# Patient Record
Sex: Male | Born: 1960 | Race: White | Hispanic: No | Marital: Married | Smoking: Never smoker
Health system: Southern US, Community
[De-identification: ages and names within clinical notes are randomized; demographics above are authoritative.]

## PROBLEM LIST (undated history)

## (undated) DIAGNOSIS — I5189 Other ill-defined heart diseases: Secondary | ICD-10-CM

## (undated) DIAGNOSIS — G4733 Obstructive sleep apnea (adult) (pediatric): Secondary | ICD-10-CM

## (undated) DIAGNOSIS — I071 Rheumatic tricuspid insufficiency: Secondary | ICD-10-CM

## (undated) DIAGNOSIS — G4730 Sleep apnea, unspecified: Secondary | ICD-10-CM

## (undated) DIAGNOSIS — J309 Allergic rhinitis, unspecified: Secondary | ICD-10-CM

## (undated) DIAGNOSIS — I340 Nonrheumatic mitral (valve) insufficiency: Secondary | ICD-10-CM

## (undated) DIAGNOSIS — I2089 Other forms of angina pectoris: Secondary | ICD-10-CM

## (undated) DIAGNOSIS — J45909 Unspecified asthma, uncomplicated: Secondary | ICD-10-CM

## (undated) DIAGNOSIS — E66811 Obesity, class 1: Secondary | ICD-10-CM

## (undated) HISTORY — PX: FRACTURE SURGERY: SHX138

## (undated) HISTORY — PX: COLONOSCOPY: SHX174

---

## 2014-03-12 ENCOUNTER — Ambulatory Visit: Payer: Self-pay | Admitting: Gastroenterology

## 2014-03-14 LAB — PATHOLOGY REPORT

## 2017-07-14 ENCOUNTER — Encounter
Admission: RE | Admit: 2017-07-14 | Discharge: 2017-07-14 | Disposition: A | Discharge status: Home / Self Care | Payer: BLUE CROSS/BLUE SHIELD | Source: Ambulatory Visit | Attending: Surgery | Admitting: Surgery

## 2017-07-14 HISTORY — DX: Unspecified asthma, uncomplicated: J45.909

## 2017-07-14 NOTE — Patient Instructions (Signed)
  Your procedure is scheduled on: 07-22-17  Report to Same Day Surgery 2nd floor medical mall [facility](Medical Mall Entrance-take elevator on left to 2nd floor.  Check in with surgery information desk.) To find out your arrival time please call [phone] between 1PM - 3PM on 07-21-17  Remember: Instructions that are not followed completely may result in serious medical risk, up to and including death, or upon the discretion of your surgeon and anesthesiologist your surgery may need to be rescheduled.    _x___ 1. Do not eat food or drink liquids after midnight. No gum chewing or hard candies.     __x__ 2. No Alcohol for 24 hours before or after surgery.   __x__3. No Smoking for 24 prior to surgery.   ____  4. Bring all medications with you on the day of surgery if instructed.    __x__ 5. Notify your doctor if there is any change in your medical condition     (cold, fever, infections).     Do not wear jewelry, make-up, hairpins, clips or nail polish.  Do not wear lotions, powders, or perfumes. You may wear deodorant.  Do not shave 48 hours prior to surgery. Men may shave face and neck.  Do not bring valuables to the hospital.    [facility] is not responsible for any belongings or valuables.               Contacts, dentures or bridgework may not be worn into surgery.  Leave your suitcase in the car. After surgery it may be brought to your room.  For patients admitted to the hospital, discharge time is determined by your treatment team.   Patients discharged the day of surgery will not be allowed to drive home.  You will need someone to drive you home and stay with you the night of your procedure.    Please read over the following fact sheets that you were given:    ____ Take anti-hypertensive (unless it includes a diuretic), cardiac, seizure, asthma,     anti-reflux and psychiatric medicines. These include:  1. NONE  2.  3.  4.  5.  6.  ____Fleets enema or Magnesium Citrate as  directed.   ____ Use CHG Soap or sage wipes as directed on instruction sheet   _X___ Use inhalers on the day of surgery and bring to hospital day of surgery-USE ALBUTEROL INHALER AT HOME AND BRING TO HOSPITAL  ____ Stop Metformin and Janumet 2 days prior to surgery.    ____ Take 1/2 of usual insulin dose the night before surgery and none on the morning surgery.   ____ Follow recommendations from Cardiologist, Pulmonologist or PCP regarding stopping Aspirin, Coumadin, Pllavix ,Eliquis, Effient, or Pradaxa, and Pletal.  X____Stop Anti-inflammatories such as Advil, Aleve, Ibuprofen, Motrin, Naproxen, Naprosyn, Goodies powders or aspirin products NOW-OK to take Tylenol    _x___ Stop supplements until after surgery-STOP FISH OIL NOW-MAY RESUME AFTER SURGERY  ____ Bring C-Pap to the hospital.

## 2017-07-21 MED ORDER — CEFAZOLIN SODIUM-DEXTROSE 2-4 GM/100ML-% IV SOLN
2.0000 g | Freq: Once | INTRAVENOUS | Status: AC
Start: 2017-07-21 — End: 2017-07-22
  Administered 2017-07-22: 2 g via INTRAVENOUS

## 2017-07-22 ENCOUNTER — Encounter
Admission: RE | Disposition: A | Discharge status: Home / Self Care | Payer: Self-pay | Source: Ambulatory Visit | Attending: Surgery

## 2017-07-22 ENCOUNTER — Ambulatory Visit
Admission: RE | Admit: 2017-07-22 | Discharge: 2017-07-22 | Disposition: A | Discharge status: Home / Self Care | Payer: BLUE CROSS/BLUE SHIELD | Source: Ambulatory Visit | Attending: Surgery | Admitting: Surgery

## 2017-07-22 ENCOUNTER — Ambulatory Visit: Payer: BLUE CROSS/BLUE SHIELD | Admitting: Anesthesiology

## 2017-07-22 ENCOUNTER — Encounter: Payer: Self-pay | Admitting: *Deleted

## 2017-07-22 DIAGNOSIS — Z7951 Long term (current) use of inhaled steroids: Secondary | ICD-10-CM | POA: Insufficient documentation

## 2017-07-22 DIAGNOSIS — J45909 Unspecified asthma, uncomplicated: Secondary | ICD-10-CM | POA: Diagnosis not present

## 2017-07-22 DIAGNOSIS — K429 Umbilical hernia without obstruction or gangrene: Secondary | ICD-10-CM | POA: Diagnosis present

## 2017-07-22 HISTORY — PX: UMBILICAL HERNIA REPAIR: SHX196

## 2017-07-22 SURGERY — REPAIR, HERNIA, UMBILICAL, ADULT
Anesthesia: General

## 2017-07-22 MED ORDER — BUPIVACAINE-EPINEPHRINE (PF) 0.5% -1:200000 IJ SOLN
INTRAMUSCULAR | Status: DC | PRN
  Administered 2017-07-22: 6 mL via PERINEURAL

## 2017-07-22 MED ORDER — LACTATED RINGERS IV SOLN
INTRAVENOUS | Status: DC
  Administered 2017-07-22: 06:00:00 via INTRAVENOUS

## 2017-07-22 MED ORDER — FENTANYL CITRATE (PF) 100 MCG/2ML IJ SOLN
25.0000 ug | INTRAMUSCULAR | Status: DC | PRN
  Administered 2017-07-22 (×4): 25 ug via INTRAVENOUS

## 2017-07-22 MED ORDER — PHENYLEPHRINE HCL 10 MG/ML IJ SOLN
INTRAMUSCULAR | Status: AC
  Filled 2017-07-22: qty 1

## 2017-07-22 MED ORDER — HYDROCODONE-ACETAMINOPHEN 5-325 MG PO TABS
1.0000 | ORAL_TABLET | ORAL | Status: DC | PRN
  Administered 2017-07-22: 1 via ORAL

## 2017-07-22 MED ORDER — FENTANYL CITRATE (PF) 100 MCG/2ML IJ SOLN
INTRAMUSCULAR | Status: DC | PRN
  Administered 2017-07-22 (×2): 50 ug via INTRAVENOUS

## 2017-07-22 MED ORDER — HYDROCODONE-ACETAMINOPHEN 5-325 MG PO TABS
ORAL_TABLET | ORAL | Status: AC
  Filled 2017-07-22: qty 1

## 2017-07-22 MED ORDER — BUPIVACAINE-EPINEPHRINE (PF) 0.5% -1:200000 IJ SOLN
INTRAMUSCULAR | Status: AC
  Filled 2017-07-22: qty 30

## 2017-07-22 MED ORDER — GLYCOPYRROLATE 0.2 MG/ML IJ SOLN
INTRAMUSCULAR | Status: DC | PRN
  Administered 2017-07-22: 0.2 mg via INTRAVENOUS

## 2017-07-22 MED ORDER — MIDAZOLAM HCL 2 MG/2ML IJ SOLN
INTRAMUSCULAR | Status: AC
  Filled 2017-07-22: qty 2

## 2017-07-22 MED ORDER — ONDANSETRON HCL 4 MG/2ML IJ SOLN
INTRAMUSCULAR | Status: AC
  Filled 2017-07-22: qty 2

## 2017-07-22 MED ORDER — FENTANYL CITRATE (PF) 100 MCG/2ML IJ SOLN
INTRAMUSCULAR | Status: AC
  Filled 2017-07-22: qty 2

## 2017-07-22 MED ORDER — MIDAZOLAM HCL 2 MG/2ML IJ SOLN
INTRAMUSCULAR | Status: DC | PRN
  Administered 2017-07-22: 2 mg via INTRAVENOUS

## 2017-07-22 MED ORDER — ONDANSETRON HCL 4 MG/2ML IJ SOLN: 4.0000 mg | Freq: Once | INTRAMUSCULAR | Status: DC | PRN

## 2017-07-22 MED ORDER — LIDOCAINE HCL (CARDIAC) 20 MG/ML IV SOLN
INTRAVENOUS | Status: DC | PRN
  Administered 2017-07-22: 100 mg via INTRAVENOUS

## 2017-07-22 MED ORDER — FENTANYL CITRATE (PF) 100 MCG/2ML IJ SOLN
INTRAMUSCULAR | Status: AC
  Administered 2017-07-22: 25 ug via INTRAVENOUS
  Filled 2017-07-22: qty 2

## 2017-07-22 MED ORDER — PHENYLEPHRINE HCL 10 MG/ML IJ SOLN
INTRAMUSCULAR | Status: DC | PRN
  Administered 2017-07-22 (×5): 100 ug via INTRAVENOUS

## 2017-07-22 MED ORDER — FAMOTIDINE 20 MG PO TABS
ORAL_TABLET | ORAL | Status: AC
  Administered 2017-07-22: 20 mg via ORAL
  Filled 2017-07-22: qty 1

## 2017-07-22 MED ORDER — PROPOFOL 500 MG/50ML IV EMUL
INTRAVENOUS | Status: AC
  Filled 2017-07-22: qty 50

## 2017-07-22 MED ORDER — FAMOTIDINE 20 MG PO TABS
20.0000 mg | ORAL_TABLET | Freq: Once | ORAL | Status: AC
  Administered 2017-07-22: 20 mg via ORAL

## 2017-07-22 MED ORDER — HYDROCODONE-ACETAMINOPHEN 5-325 MG PO TABS: 1.0000 | ORAL_TABLET | ORAL | 0 refills | Status: DC | PRN

## 2017-07-22 MED ORDER — PROPOFOL 10 MG/ML IV BOLUS
INTRAVENOUS | Status: DC | PRN
  Administered 2017-07-22: 180 mg via INTRAVENOUS

## 2017-07-22 MED ORDER — ONDANSETRON HCL 4 MG/2ML IJ SOLN
INTRAMUSCULAR | Status: DC | PRN
  Administered 2017-07-22: 4 mg via INTRAVENOUS

## 2017-07-22 MED ORDER — DEXAMETHASONE SODIUM PHOSPHATE 10 MG/ML IJ SOLN
INTRAMUSCULAR | Status: DC | PRN
  Administered 2017-07-22: 10 mg via INTRAVENOUS

## 2017-07-22 MED ORDER — DEXAMETHASONE SODIUM PHOSPHATE 10 MG/ML IJ SOLN
INTRAMUSCULAR | Status: AC
  Filled 2017-07-22: qty 1

## 2017-07-22 MED ORDER — LIDOCAINE HCL (PF) 2 % IJ SOLN
INTRAMUSCULAR | Status: AC
Start: 2017-07-22 — End: 2017-07-22
  Filled 2017-07-22: qty 2

## 2017-07-22 MED ORDER — CEFAZOLIN SODIUM-DEXTROSE 2-4 GM/100ML-% IV SOLN
INTRAVENOUS | Status: AC
  Filled 2017-07-22: qty 100

## 2017-07-22 SURGICAL SUPPLY — 28 items
BLADE CLIPPER SURG (BLADE) ×2 IMPLANT
BLADE SURG 15 STRL LF DISP TIS (BLADE) ×1 IMPLANT
BLADE SURG 15 STRL SS (BLADE) ×1
CANISTER SUCT 1200ML W/VALVE (MISCELLANEOUS) ×2 IMPLANT
CHLORAPREP W/TINT 26ML (MISCELLANEOUS) ×2 IMPLANT
DECANTER SPIKE VIAL GLASS SM (MISCELLANEOUS) ×2 IMPLANT
DERMABOND ADVANCED (GAUZE/BANDAGES/DRESSINGS) ×1
DERMABOND ADVANCED .7 DNX12 (GAUZE/BANDAGES/DRESSINGS) ×1 IMPLANT
DRAPE LAPAROTOMY 77X122 PED (DRAPES) ×2 IMPLANT
ELECT REM PT RETURN 9FT ADLT (ELECTROSURGICAL) ×2
ELECTRODE REM PT RTRN 9FT ADLT (ELECTROSURGICAL) ×1 IMPLANT
GLOVE BIO SURGEON STRL SZ7 (GLOVE) ×4 IMPLANT
GLOVE BIO SURGEON STRL SZ7.5 (GLOVE) ×6 IMPLANT
GOWN STRL REUS W/ TWL LRG LVL3 (GOWN DISPOSABLE) ×3 IMPLANT
GOWN STRL REUS W/TWL LRG LVL3 (GOWN DISPOSABLE) ×3
KIT RM TURNOVER STRD PROC AR (KITS) ×2 IMPLANT
LABEL OR SOLS (LABEL) ×2 IMPLANT
MESH SYNTHETIC 4X6 SOFT BARD (Mesh General) ×1 IMPLANT
MESH SYNTHETIC SOFT BARD 4X6 (Mesh General) ×1 IMPLANT
NEEDLE HYPO 25X1 1.5 SAFETY (NEEDLE) ×2 IMPLANT
NS IRRIG 500ML POUR BTL (IV SOLUTION) ×2 IMPLANT
PACK BASIN MINOR ARMC (MISCELLANEOUS) ×2 IMPLANT
SUT CHROMIC 3 0 SH 27 (SUTURE) ×2 IMPLANT
SUT CHROMIC 4 0 RB 1X27 (SUTURE) IMPLANT
SUT MNCRL+ 5-0 UNDYED PC-3 (SUTURE) ×1 IMPLANT
SUT MONOCRYL 5-0 (SUTURE) ×1
SUT SURGILON 0 30 BLK (SUTURE) ×2 IMPLANT
SYRINGE 10CC LL (SYRINGE) ×2 IMPLANT

## 2017-07-22 NOTE — Anesthesia Postprocedure Evaluation (Signed)
Anesthesia Post Note  Patient: Kenneth PatellaBilly F Partin Jr.  Procedure(s) Performed: Procedure(s) (LRB): HERNIA REPAIR UMBILICAL ADULT (N/A)  Patient location during evaluation: PACU Anesthesia Type: General Level of consciousness: awake and alert Pain management: pain level controlled Vital Signs Assessment: post-procedure vital signs reviewed and stable Respiratory status: spontaneous breathing, nonlabored ventilation, respiratory function stable and patient connected to nasal cannula oxygen Cardiovascular status: blood pressure returned to baseline and stable Postop Assessment: no signs of nausea or vomiting Anesthetic complications: no     Last Vitals:  Vitals:   07/22/17 1001 07/22/17 1016  BP: 120/82 117/81  Pulse: 60 65  Resp: 14 12  Temp:      Last Pain:  Vitals:   07/22/17 1001  TempSrc:   PainSc: 2                  Wray Goehring S

## 2017-07-22 NOTE — Anesthesia Procedure Notes (Signed)
Procedure Name: LMA Insertion Date/Time: 07/22/2017 7:43 AM Performed by: Irving BurtonBACHICH, Pippa Hanif Pre-anesthesia Checklist: Patient identified, Emergency Drugs available, Suction available and Patient being monitored Patient Re-evaluated:Patient Re-evaluated prior to induction Oxygen Delivery Method: Circle system utilized Preoxygenation: Pre-oxygenation with 100% oxygen Induction Type: IV induction Ventilation: Mask ventilation without difficulty LMA: LMA inserted LMA Size: 4.5 Number of attempts: 1 Placement Confirmation: positive ETCO2 and breath sounds checked- equal and bilateral Tube secured with: Tape Dental Injury: Teeth and Oropharynx as per pre-operative assessment

## 2017-07-22 NOTE — Anesthesia Post-op Follow-up Note (Cosign Needed)
Anesthesia QCDR form completed.        

## 2017-07-22 NOTE — Transfer of Care (Signed)
Immediate Anesthesia Transfer of Care Note  Patient: Kenneth PatellaBilly F Yusupov Jr.  Procedure(s) Performed: Procedure(s) with comments: HERNIA REPAIR UMBILICAL ADULT (N/A) - with mesh  Patient Location: PACU  Anesthesia Type:General  Level of Consciousness: sedated  Airway & Oxygen Therapy: Patient connected to face mask oxygen  Post-op Assessment: Post -op Vital signs reviewed and stable  Post vital signs: stable  Last Vitals:  Vitals:   07/22/17 0839 07/22/17 0840  BP: 115/79 115/79  Pulse:  73  Resp: 14 14  Temp: 36.7 C     Last Pain:  Vitals:   07/22/17 0603  TempSrc: Tympanic         Complications: No apparent anesthesia complications

## 2017-07-22 NOTE — Discharge Instructions (Signed)
AMBULATORY SURGERY  DISCHARGE INSTRUCTIONS   1) The drugs that you were given will stay in your system until tomorrow so for the next 24 hours you should not:  A) Drive an automobile B) Make any legal decisions C) Drink any alcoholic beverage   2) You may resume regular meals tomorrow.  Today it is better to start with liquids and gradually work up to solid foods.  You may eat anything you prefer, but it is better to start with liquids, then soup and crackers, and gradually work up to solid foods.   3) Please notify your doctor immediately if you have any unusual bleeding, trouble breathing, redness and pain at the surgery site, drainage, fever, or pain not relieved by medication. 4)   5) Your post-operative visit with Dr.                                     is: Date:                        Time:    Please call to schedule your post-operative visit.  6) Additional Instructions: Take Tylenol or Norco if needed for pain.  7) Should not drive or do anything dangerous when taking Norco.  8) May shower and blot dry.  9) Avoid straining and heavy lifting.
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
## 2017-07-22 NOTE — Op Note (Signed)
OPERATIVE REPORT  PREOPERATIVE  DIAGNOSIS: . Umbilical hernia  POSTOPERATIVE DIAGNOSIS: . Umbilical hernia  PROCEDURE: . Umbilical hernia repair  ANESTHESIA:  General  SURGEON: Renda RollsWilton Antonette Hendricks  MD   INDICATIONS: . He reports recent bulging at the umbilicus. He had physical findings of an umbilical hernia with fascial ring defect. Repair was recommended for definitive treatment.  With the patient on the operating table in the supine position he was placed under general anesthesia. The abdomen was prepared with clippers and with ChloraPrep and draped in a sterile manner. A supraumbilical transversely oriented curvilinear incision was made and carried down through subcutaneous tissues. Several small bleeding points were cauterized. The umbilical hernia sac was dissected circumferentially and separated from the skin of the umbilicus. Its continuity with the peritoneal cavity was demonstrated. The sac was dissected free from the fascial ring defect and inverted back into the peritoneal cavity. The properitoneal fat was separated from the fascial ring defect. Bard soft mesh was cut to create a circular shape of 1.8 cm in diameter. This was placed into the properitoneal plane and sutured to the overlying fascia with through and through 0 Surgilon. Next the repair was carried out with a transversely oriented suture line of interrupted 0 Surgilon figure-of-eight sutures incorporating each suture into the mesh. The subcutaneous tissues were infiltrated with half percent Sensorcaine with epinephrine. The skin of the umbilicus was sutured to the deep fascia with 3-0 chromic. The skin was closed with running 5-0 Monocryl subcuticular suture and Dermabond.  The patient tolerated surgery satisfactorily and was then prepared for transfer to the recovery room  [facility] Inc[facility]ilton Jernie Schutt M.D.

## 2017-07-22 NOTE — H&P (Signed)
  He reports no change in overall condition since the day of the office exam.  Lab work reviewed  I discussed the plan for umbilical hernia repair

## 2019-06-24 ENCOUNTER — Emergency Department
Admission: EM | Admit: 2019-06-24 | Discharge: 2019-06-25 | Disposition: A | Discharge status: Other Acute Inpatient Hospital | Payer: BC Managed Care – PPO | Attending: Emergency Medicine | Admitting: Emergency Medicine

## 2019-06-24 ENCOUNTER — Encounter: Payer: Self-pay | Admitting: Emergency Medicine

## 2019-06-24 ENCOUNTER — Emergency Department: Payer: BC Managed Care – PPO

## 2019-06-24 ENCOUNTER — Other Ambulatory Visit: Payer: Self-pay

## 2019-06-24 DIAGNOSIS — J45909 Unspecified asthma, uncomplicated: Secondary | ICD-10-CM | POA: Diagnosis not present

## 2019-06-24 DIAGNOSIS — S42001A Fracture of unspecified part of right clavicle, initial encounter for closed fracture: Secondary | ICD-10-CM | POA: Insufficient documentation

## 2019-06-24 DIAGNOSIS — S0990XA Unspecified injury of head, initial encounter: Secondary | ICD-10-CM | POA: Diagnosis present

## 2019-06-24 DIAGNOSIS — S2241XA Multiple fractures of ribs, right side, initial encounter for closed fracture: Secondary | ICD-10-CM | POA: Insufficient documentation

## 2019-06-24 DIAGNOSIS — S060X1A Concussion with loss of consciousness of 30 minutes or less, initial encounter: Secondary | ICD-10-CM | POA: Insufficient documentation

## 2019-06-24 DIAGNOSIS — Y929 Unspecified place or not applicable: Secondary | ICD-10-CM | POA: Insufficient documentation

## 2019-06-24 DIAGNOSIS — Y999 Unspecified external cause status: Secondary | ICD-10-CM | POA: Diagnosis not present

## 2019-06-24 DIAGNOSIS — Z79899 Other long term (current) drug therapy: Secondary | ICD-10-CM | POA: Diagnosis not present

## 2019-06-24 DIAGNOSIS — S270XXA Traumatic pneumothorax, initial encounter: Secondary | ICD-10-CM | POA: Insufficient documentation

## 2019-06-24 DIAGNOSIS — Y939 Activity, unspecified: Secondary | ICD-10-CM | POA: Diagnosis not present

## 2019-06-24 LAB — TYPE AND SCREEN
ABO/RH(D): A POS
Antibody Screen: NEGATIVE

## 2019-06-24 LAB — COMPREHENSIVE METABOLIC PANEL
ALT: 48 U/L — ABNORMAL HIGH (ref 0–44)
AST: 52 U/L — ABNORMAL HIGH (ref 15–41)
Albumin: 4.5 g/dL (ref 3.5–5.0)
Alkaline Phosphatase: 47 U/L (ref 38–126)
Anion gap: 13 (ref 5–15)
BUN: 19 mg/dL (ref 6–20)
CO2: 22 mmol/L (ref 22–32)
Calcium: 8.9 mg/dL (ref 8.9–10.3)
Chloride: 106 mmol/L (ref 98–111)
Creatinine, Ser: 1.27 mg/dL — ABNORMAL HIGH (ref 0.61–1.24)
GFR calc Af Amer: >60 mL/min (ref >60)
GFR calc non Af Amer: >60 mL/min (ref >60)
Glucose, Bld: 146 mg/dL — ABNORMAL HIGH (ref 70–99)
Potassium: 4.3 mmol/L (ref 3.5–5.1)
Sodium: 141 mmol/L (ref 135–145)
Total Bilirubin: 0.6 mg/dL (ref 0.3–1.2)
Total Protein: 7.5 g/dL (ref 6.5–8.1)

## 2019-06-24 LAB — CBC WITH DIFFERENTIAL/PLATELET
Abs Immature Granulocytes: 0.08 10*3/uL — ABNORMAL HIGH (ref 0.00–0.07)
Basophils Absolute: 0.1 10*3/uL (ref 0.0–0.1)
Basophils Relative: 1 %
Eosinophils Absolute: 0.3 10*3/uL (ref 0.0–0.5)
Eosinophils Relative: 2 %
HCT: 43.6 % (ref 39.0–52.0)
Hemoglobin: 14.9 g/dL (ref 13.0–17.0)
Immature Granulocytes: 1 %
Lymphocytes Relative: 22 %
Lymphs Abs: 2.5 10*3/uL (ref 0.7–4.0)
MCH: 30.2 pg (ref 26.0–34.0)
MCHC: 34.2 g/dL (ref 30.0–36.0)
MCV: 88.4 fL (ref 80.0–100.0)
Monocytes Absolute: 0.5 10*3/uL (ref 0.1–1.0)
Monocytes Relative: 4 %
Neutro Abs: 8.2 10*3/uL — ABNORMAL HIGH (ref 1.7–7.7)
Neutrophils Relative %: 70 %
Platelets: 251 10*3/uL (ref 150–400)
RBC: 4.93 MIL/uL (ref 4.22–5.81)
RDW: 12.7 % (ref 11.5–15.5)
WBC: 11.5 10*3/uL — ABNORMAL HIGH (ref 4.0–10.5)
nRBC: 0 % (ref 0.0–0.2)

## 2019-06-24 LAB — PROTIME-INR
INR: 0.9 (ref 0.8–1.2)
Prothrombin Time: 11.9 seconds (ref 11.4–15.2)

## 2019-06-24 LAB — ETHANOL: Alcohol, Ethyl (B): <10 mg/dL (ref <10)

## 2019-06-24 MED ORDER — FENTANYL CITRATE (PF) 100 MCG/2ML IJ SOLN
50.0000 ug | Freq: Once | INTRAMUSCULAR | Status: AC
  Administered 2019-06-24: 50 ug via INTRAVENOUS
  Filled 2019-06-24: qty 2

## 2019-06-24 MED ORDER — IOHEXOL 300 MG/ML  SOLN
125.0000 mL | Freq: Once | INTRAMUSCULAR | Status: AC | PRN
  Administered 2019-06-24: 150 mL via INTRAVENOUS

## 2019-06-24 NOTE — ED Notes (Signed)
No NSAIDs or blood thinners taken
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
## 2019-06-25 MED ORDER — FENTANYL CITRATE (PF) 100 MCG/2ML IJ SOLN
INTRAMUSCULAR | Status: AC
  Administered 2019-06-25: 50 ug via INTRAVENOUS
  Filled 2019-06-25: qty 2

## 2019-06-25 MED ORDER — ONDANSETRON HCL 4 MG/2ML IJ SOLN
4.00 | INTRAMUSCULAR | Status: DC
Start: ? — End: 2019-06-25

## 2019-06-25 MED ORDER — KCL IN DEXTROSE-NACL 20-5-0.45 MEQ/L-%-% IV SOLN
125.00 | INTRAVENOUS | Status: DC
Start: ? — End: 2019-06-25

## 2019-06-25 MED ORDER — ACETAMINOPHEN 500 MG PO TABS
1000.00 | ORAL_TABLET | ORAL | Status: DC
Start: 2019-06-26 — End: 2019-06-25

## 2019-06-25 MED ORDER — ENOXAPARIN SODIUM 30 MG/0.3ML ~~LOC~~ SOLN
30.00 | SUBCUTANEOUS | Status: DC
Start: 2019-06-26 — End: 2019-06-25

## 2019-06-25 MED ORDER — HYDROMORPHONE HCL 1 MG/ML IJ SOLN
.50 | INTRAMUSCULAR | Status: DC
Start: ? — End: 2019-06-25

## 2019-06-25 MED ORDER — LIDOCAINE 5 % EX PTCH
2.00 | MEDICATED_PATCH | CUTANEOUS | Status: DC
Start: 2019-06-26 — End: 2019-06-25

## 2019-06-25 MED ORDER — GABAPENTIN 300 MG PO CAPS
300.00 | ORAL_CAPSULE | ORAL | Status: DC
Start: 2019-06-26 — End: 2019-06-25

## 2019-06-25 MED ORDER — IBUPROFEN 600 MG PO TABS
600.00 | ORAL_TABLET | ORAL | Status: DC
Start: 2019-06-26 — End: 2019-06-25

## 2019-06-25 MED ORDER — FENTANYL CITRATE (PF) 100 MCG/2ML IJ SOLN
50.0000 ug | Freq: Once | INTRAMUSCULAR | Status: AC
  Administered 2019-06-25: 50 ug via INTRAVENOUS

## 2019-06-25 MED ORDER — GENERIC EXTERNAL MEDICATION
Status: DC
Start: ? — End: 2019-06-25

## 2019-06-25 NOTE — ED Notes (Signed)
Pt given phone and charger from wife  Charge at [facility] ED, [employer], called for update on pt
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
## 2021-04-21 ENCOUNTER — Other Ambulatory Visit: Payer: Self-pay

## 2021-04-21 ENCOUNTER — Ambulatory Visit
Admission: EM | Admit: 2021-04-21 | Discharge: 2021-04-21 | Disposition: A | Discharge status: Home / Self Care | Payer: BC Managed Care – PPO | Attending: Family Medicine | Admitting: Family Medicine

## 2021-04-21 DIAGNOSIS — U071 COVID-19: Secondary | ICD-10-CM | POA: Diagnosis not present

## 2021-04-21 DIAGNOSIS — R519 Headache, unspecified: Secondary | ICD-10-CM | POA: Diagnosis present

## 2021-04-21 DIAGNOSIS — J069 Acute upper respiratory infection, unspecified: Secondary | ICD-10-CM | POA: Diagnosis not present

## 2021-04-21 LAB — SARS CORONAVIRUS 2 (TAT 6-24 HRS): SARS Coronavirus 2: POSITIVE — AB
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
## 2021-04-21 NOTE — ED Provider Notes (Signed)
[facility]    CSN: [id number] Arrival date & time: 04/21/21  2836      History   Chief Complaint Chief Complaint  Patient presents with  . Headache    HPI Kenneth Chapman. is a 60 y.o. male.   HPI   60 year old male here for evaluation of headache, runny nose, and fatigue.  Patient reports he has had symptoms for the past 2 days.  He found out that he was recently exposed to a coworker and his wife are both COVID-positive.  Patient states he is having some chest congestion and a nonproductive cough.  Patient denies sore throat, fever, ear pain or pressure, shortness of breath or wheezing, GI complaints, or changes to her sense of taste or smell.  Past Medical History:  Diagnosis Date  . Asthma     There are no problems to display for this patient.   Past Surgical History:  Procedure Laterality Date  . COLONOSCOPY    . FRACTURE SURGERY     AGE 89  . UMBILICAL HERNIA REPAIR N/A 07/22/2017   Procedure: HERNIA REPAIR UMBILICAL ADULT;  Surgeon: Nadeen Landau, MD;  Location: [facility] ORS;  Service: General;  Laterality: N/A;  with mesh       Home Medications    Prior to Admission medications   Medication Sig Start Date End Date Taking? Authorizing Provider  cetirizine (ZYRTEC) 10 MG tablet Take 10 mg by mouth daily.   Yes [provider]  Multiple Vitamins-Minerals (MULTIVITAMIN WITH MINERALS) tablet Take 1 tablet by mouth daily.   Yes [provider]  Omega-3 Fatty Acids (FISH OIL) 1000 MG CAPS Take 1 capsule by mouth daily.   Yes [provider]  albuterol (PROVENTIL HFA;VENTOLIN HFA) 108 (90 Base) MCG/ACT inhaler Inhale 2 puffs into the lungs every 6 (six) hours as needed for wheezing or shortness of breath.  04/21/21  [provider]  Fluticasone-Salmeterol (ADVAIR) 250-50 MCG/DOSE AEPB Inhale 1 puff into the lungs 2 (two) times daily as needed.  04/21/21  [provider]    Family History History reviewed.  No pertinent family history.  Social History Social History   Tobacco Use  . Smoking status: Never Smoker  . Smokeless tobacco: Never Used  Vaping Use  . Vaping Use: Never used  Substance Use Topics  . Alcohol use: Yes    Comment: OCC   . Drug use: No     Allergies   Patient has no known allergies.   Review of Systems Review of Systems  Constitutional: Positive for fatigue. Negative for activity change, appetite change and fever.  HENT: Positive for congestion and rhinorrhea.   Respiratory: Positive for cough. Negative for shortness of breath and wheezing.   Gastrointestinal: Negative for diarrhea, nausea and vomiting.  Musculoskeletal: Negative for arthralgias and myalgias.  Skin: Negative for rash.  Neurological: Positive for headaches.  Hematological: Negative.   Psychiatric/Behavioral: Negative.      Physical Exam Triage Vital Signs ED Triage Vitals  Enc Vitals Group     BP 04/21/21 0933 (!) 131/96     Pulse Rate 04/21/21 0933 87     Resp 04/21/21 0933 16     Temp 04/21/21 0933 98.5 F (36.9 C)     Temp Source 04/21/21 0933 Oral     SpO2 04/21/21 0933 98 %     Weight 04/21/21 0933 220 lb (99.8 kg)     Height 04/21/21 0933 5' 11.5" (1.816 m)  Head Circumference --      Peak Flow --      Pain Score 04/21/21 0932 0     Pain Loc --      Pain Edu? --      Excl. in GC? --    No data found.  Updated Vital Signs BP (!) 131/96 (BP Location: Left Arm)   Pulse 87   Temp 98.5 F (36.9 C) (Oral)   Resp 16   Ht 5' 11.5" (1.816 m)   Wt 220 lb (99.8 kg)   SpO2 98%   BMI 30.26 kg/m   Visual Acuity Right Eye Distance:   Left Eye Distance:   Bilateral Distance:    Right Eye Near:   Left Eye Near:    Bilateral Near:     Physical Exam Vitals and nursing note reviewed.  Constitutional:      General: He is not in acute distress.    Appearance: Normal appearance.  HENT:     Head: Normocephalic and atraumatic.     Right Ear: Tympanic membrane, ear  canal and external ear normal. There is no impacted cerumen.     Left Ear: Tympanic membrane, ear canal and external ear normal. There is no impacted cerumen.     Nose: Congestion and rhinorrhea present.     Mouth/Throat:     Mouth: Mucous membranes are moist.     Pharynx: Oropharynx is clear.  Cardiovascular:     Rate and Rhythm: Normal rate and regular rhythm.     Pulses: Normal pulses.     Heart sounds: Normal heart sounds. No murmur heard. No gallop.   Pulmonary:     Effort: Pulmonary effort is normal.     Breath sounds: Normal breath sounds. No wheezing, rhonchi or rales.  Musculoskeletal:     Cervical back: Normal range of motion and neck supple.  Lymphadenopathy:     Cervical: No cervical adenopathy.  Skin:    General: Skin is warm and dry.     Capillary Refill: Capillary refill takes less than 2 seconds.     Findings: No rash.  Neurological:     General: No focal deficit present.     Mental Status: He is alert and oriented to person, place, and time.  Psychiatric:        Mood and Affect: Mood normal.        Behavior: Behavior normal.        Thought Content: Thought content normal.        Judgment: Judgment normal.      UC Treatments / Results  Labs (all labs ordered are listed, but only abnormal results are displayed) Labs Reviewed  SARS CORONAVIRUS 2 (TAT 6-24 HRS)    EKG   Radiology No results found.  Procedures Procedures (including critical care time)  Medications Ordered in UC Medications - No data to display  Initial Impression / Assessment and Plan / UC Course  I have reviewed the triage vital signs and the nursing notes.  Pertinent labs & imaging results that were available during my care of the patient were reviewed by me and considered in my medical decision making (see chart for details).   Patient is a very pleasant, nontoxic-appearing 8-year-old male here for evaluation of COVID-like symptoms after being exposed 2 days ago.  His symptoms  consist of headache, nasal congestion, runny nose, chest congestion, fatigue, and nonproductive cough.  Physical exam reveals pearly gray tympanic membranes bilaterally with a normal light reflex and clear  external auditory canals.  Nasal mucosa is mildly erythematous and edematous with scant clear nasal discharge.  Posterior oropharynx has mild erythema with clear postnasal drip.  No cervical lymphadenopathy on exam.  Cardiopulmonary exam is benign.  Will discharge patient home to isolate pending the results of his COVID test that was collected at triage.  Patient's cough is not significant so will not provide Promethazine DM or Tessalon Perles at this time.  Return precautions and ER precautions reviewed with patient.  Patient states that he does not need a work note.   Final Clinical Impressions(s) / UC Diagnoses   Final diagnoses:  Upper respiratory tract infection, unspecified type     Discharge Instructions     Isolate at home pending the results of your COVID test.  If you test positive then you will have to quarantine for 5 days from the start of your symptoms.  After 5 days you can break quarantine if your symptoms have improved and you have not had a fever for 24 hours without taking Tylenol or ibuprofen.  Use over-the-counter Tylenol and ibuprofen as needed for body aches and fever.  If you develop any increased shortness of breath-especially at rest, you are unable to speak in full sentences, or is a late sign your lips are turning blue you need to go the ER for evaluation.     ED Prescriptions    None     PDMP not reviewed this encounter.   Becky Augusta, NP 04/21/21 1031

## 2021-04-21 NOTE — Discharge Instructions (Addendum)
Isolate at home pending the results of your COVID test.  If you test positive then you will have to quarantine for 5 days from the start of your symptoms.  After 5 days you can break quarantine if your symptoms have improved and you have not had a fever for 24 hours without taking Tylenol or ibuprofen.  Use over-the-counter Tylenol and ibuprofen as needed for body aches and fever.  If you develop any increased shortness of breath-especially at rest, you are unable to speak in full sentences, or is a late sign your lips are turning blue you need to go the ER for evaluation.  

## 2021-04-22 ENCOUNTER — Telehealth: Payer: Self-pay | Admitting: Nurse Practitioner

## 2021-04-22 NOTE — Telephone Encounter (Signed)
Called to discuss with patient about COVID-19 symptoms and the use of one of the available treatments for those with mild to moderate Covid symptoms and at a high risk of hospitalization.  Pt appears to qualify for outpatient treatment due to co-morbid conditions and/or a member of an at-risk group in accordance with the FDA Emergency Use Authorization.    Symptom onset: 04/19/2021 Vaccinated: No  Booster? No  Immunocompromised? No Qualifiers: Obesity, asthma  NIH Criteria: 4  Unable to reach pt - Voicemail and My chart message left. Patient would be a good candidate for Paxlovid. Unclear if recent lab work has been performed.   Willette Alma, NP Covid Team/Infusion

## 2022-11-06 ENCOUNTER — Other Ambulatory Visit: Payer: Self-pay | Admitting: Internal Medicine

## 2022-11-06 DIAGNOSIS — I2089 Other forms of angina pectoris: Secondary | ICD-10-CM

## 2022-11-06 DIAGNOSIS — R0602 Shortness of breath: Secondary | ICD-10-CM

## 2022-11-25 ENCOUNTER — Telehealth (HOSPITAL_COMMUNITY): Payer: Self-pay | Admitting: *Deleted

## 2022-11-25 MED ORDER — METOPROLOL TARTRATE 100 MG PO TABS: ORAL_TABLET | ORAL | 0 refills | Status: DC

## 2022-11-25 NOTE — Telephone Encounter (Signed)
Reaching out to patient to offer assistance regarding upcoming cardiac imaging study; pt verbalizes understanding of appt date/time, parking situation and where to check in, pre-test NPO status and medications ordered, and verified current allergies; name and call back number provided for further questions should they arise ° °Nancyann Cotterman RN Navigator Cardiac Imaging °[facility] °[phone] office °[phone] cell ° °Patient to take 100mg metoprolol tartrate two hours prior to his cardiac CT scan. °

## 2022-11-26 ENCOUNTER — Ambulatory Visit
Admission: RE | Admit: 2022-11-26 | Discharge: 2022-11-26 | Disposition: A | Discharge status: Home / Self Care | Payer: BC Managed Care – PPO | Source: Ambulatory Visit | Attending: Internal Medicine | Admitting: Internal Medicine

## 2022-11-26 DIAGNOSIS — I2089 Other forms of angina pectoris: Secondary | ICD-10-CM | POA: Diagnosis present

## 2022-11-26 DIAGNOSIS — R0602 Shortness of breath: Secondary | ICD-10-CM | POA: Insufficient documentation

## 2022-11-26 MED ORDER — NITROGLYCERIN 0.4 MG SL SUBL
SUBLINGUAL_TABLET | SUBLINGUAL | Status: AC
  Filled 2022-11-26: qty 2

## 2022-11-26 MED ORDER — NITROGLYCERIN 0.4 MG SL SUBL
0.8000 mg | SUBLINGUAL_TABLET | Freq: Once | SUBLINGUAL | Status: AC
  Administered 2022-11-26: 0.8 mg via SUBLINGUAL
  Filled 2022-11-26: qty 25

## 2022-11-26 MED ORDER — IOHEXOL 350 MG/ML SOLN
100.0000 mL | Freq: Once | INTRAVENOUS | Status: AC | PRN
  Administered 2022-11-26: 100 mL via INTRAVENOUS

## 2022-12-03 ENCOUNTER — Ambulatory Visit: Admission: RE | Admit: 2022-12-03 | Payer: BC Managed Care – PPO | Source: Ambulatory Visit

## 2024-09-26 ENCOUNTER — Encounter: Payer: Self-pay | Admitting: Gastroenterology

## 2024-09-28 ENCOUNTER — Encounter: Payer: Self-pay | Admitting: Gastroenterology

## 2024-09-29 ENCOUNTER — Encounter: Payer: Self-pay | Admitting: Gastroenterology

## 2024-09-29 NOTE — Anesthesia Preprocedure Evaluation (Addendum)
 Anesthesia Evaluation  Patient identified by MRN, date of birth, ID band Patient awake    Reviewed: Allergy & Precautions, H&P , NPO status , Patient's Chart, lab work & pertinent test results  Airway Mallampati: III  TM Distance: >3 FB Neck ROM: Full    Dental no notable dental hx.    Pulmonary neg pulmonary ROS, asthma , sleep apnea  SPIROMETRIC DATA: 06/22/23- PFT - FEV1- 2.31L- 63% referred to [facility] clinic due to mild asthma with allergies. He shares he uses albuterol PNR, zyrtec and Advair q12h. 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 He was in [employer] in the past     Pulmonary exam normal breath sounds clear to auscultation       Cardiovascular + angina  negative cardio ROS Normal cardiovascular exam Rhythm:Regular Rate:Normal  11-10-22 echo LEFT VENTRICLE                   Size: Normal                        Anterior: Normal            Contraction: Normal                         Lateral: Normal             Closest EF: >55% (Estimated)                Septal: Normal           Dias.FxClass: (Grade 1) relaxation abnormal, E/A reversal                Aortic: TRIVIAL AR                 No AS                Mitral: MILD MR                    No MS             Tricuspid: MILD TR                    No TS              Pulmonary: TRIVIAL PR                 No PS   INTERPRETATION  NORMAL LEFT VENTRICULAR SYSTOLIC FUNCTION WITH AN ESTIMATED EF = >55 %  NORMAL RIGHT VENTRICULAR SYSTOLIC FUNCTION  MILD TRICUSPID AND MITRAL VALVE INSUFFICIENCY  TRACE AORTIC VALVE INSUFFICIENCY  NO VALVULAR STENOSIS  MILD RV ENLARGEMENT   NM Myocardial Perfusion SPECT multiple (stress and rest):   Electronically signed by    Cara Lovelace, MD on 11/14/2022 05: 52 PM    11-26-22 IMPRESSION:  1. Coronary calcium score of 6.04. This was 31st percentile for age  and sex matched control.   2.  Normal coronary origin with right dominance.   3. Minimal proximal LAD and LCx stenosis (<25%).   4. CAD-RADS 1. Minimal non-obstructive CAD (0-24%). Consider  non-atherosclerotic causes of dyspnea. Consider preventive therapy  and risk factor modification.   Electronically Signed:  By: Redell Cave M.D.  On: 11/26/2022 16:44     Neuro/Psych negative neurological ROS  negative psych ROS   GI/Hepatic negative GI ROS, Neg  liver ROS,,,  Endo/Other  negative endocrine ROS    Renal/GU negative Renal ROS  negative genitourinary   Musculoskeletal negative musculoskeletal ROS (+)    Abdominal   Peds negative pediatric ROS (+)  Hematology negative hematology ROS (+)   Anesthesia Other Findings Medical History  Asthma  Sleep apnea on CPAP Mild mitral regurgitation by prior echocardiogram  Mild tricuspid regurgitation by prior echocardiogram Angina at rest  Obesity (BMI 30.0-34.9) Grade I diastolic dysfunction  Allergic rhinitis    Reproductive/Obstetrics negative OB ROS                              Anesthesia Physical Anesthesia Plan  ASA: 3  Anesthesia Plan: General   Post-op Pain Management:    Induction: Intravenous  PONV Risk Score and Plan:   Airway Management Planned: Natural Airway and Nasal Cannula  Additional Equipment:   Intra-op Plan:   Post-operative Plan:   Informed Consent: I have reviewed the patients History and Physical, chart, labs and discussed the procedure including the risks, benefits and alternatives for the proposed anesthesia with the patient or authorized representative who has indicated his/her understanding and acceptance.     Dental Advisory Given  Plan Discussed with: Anesthesiologist, CRNA and Surgeon  Anesthesia Plan Comments: (Patient consented for risks of anesthesia including but not limited to:  - adverse reactions to medications - risk of airway placement if required - damage to  eyes, teeth, lips or other oral mucosa - nerve damage due to positioning  - sore throat or hoarseness - Damage to heart, brain, nerves, lungs, other parts of body or loss of life  Patient voiced understanding and assent.)         Anesthesia Quick Evaluation

## 2024-10-03 ENCOUNTER — Other Ambulatory Visit: Payer: Self-pay

## 2024-10-03 ENCOUNTER — Ambulatory Visit
Admission: RE | Admit: 2024-10-03 | Discharge: 2024-10-03 | Disposition: A | Discharge status: Home / Self Care | Attending: Gastroenterology | Admitting: Gastroenterology

## 2024-10-03 ENCOUNTER — Ambulatory Visit: Payer: Self-pay | Admitting: Anesthesiology

## 2024-10-03 ENCOUNTER — Encounter
Admission: RE | Disposition: A | Discharge status: Home / Self Care | Payer: Self-pay | Source: Home / Self Care | Attending: Gastroenterology

## 2024-10-03 ENCOUNTER — Encounter: Payer: Self-pay | Admitting: Gastroenterology

## 2024-10-03 DIAGNOSIS — D123 Benign neoplasm of transverse colon: Secondary | ICD-10-CM | POA: Insufficient documentation

## 2024-10-03 DIAGNOSIS — K635 Polyp of colon: Secondary | ICD-10-CM | POA: Insufficient documentation

## 2024-10-03 DIAGNOSIS — K644 Residual hemorrhoidal skin tags: Secondary | ICD-10-CM | POA: Diagnosis not present

## 2024-10-03 DIAGNOSIS — J45909 Unspecified asthma, uncomplicated: Secondary | ICD-10-CM | POA: Diagnosis not present

## 2024-10-03 DIAGNOSIS — Q438 Other specified congenital malformations of intestine: Secondary | ICD-10-CM | POA: Diagnosis not present

## 2024-10-03 DIAGNOSIS — G4733 Obstructive sleep apnea (adult) (pediatric): Secondary | ICD-10-CM | POA: Diagnosis not present

## 2024-10-03 DIAGNOSIS — Z7951 Long term (current) use of inhaled steroids: Secondary | ICD-10-CM | POA: Diagnosis not present

## 2024-10-03 DIAGNOSIS — Z6830 Body mass index (BMI) 30.0-30.9, adult: Secondary | ICD-10-CM | POA: Insufficient documentation

## 2024-10-03 DIAGNOSIS — Z1211 Encounter for screening for malignant neoplasm of colon: Secondary | ICD-10-CM | POA: Diagnosis present

## 2024-10-03 DIAGNOSIS — Z79899 Other long term (current) drug therapy: Secondary | ICD-10-CM | POA: Diagnosis not present

## 2024-10-03 DIAGNOSIS — Z860101 Personal history of adenomatous and serrated colon polyps: Secondary | ICD-10-CM | POA: Diagnosis present

## 2024-10-03 DIAGNOSIS — D124 Benign neoplasm of descending colon: Secondary | ICD-10-CM | POA: Diagnosis not present

## 2024-10-03 DIAGNOSIS — I209 Angina pectoris, unspecified: Secondary | ICD-10-CM | POA: Diagnosis not present

## 2024-10-03 DIAGNOSIS — K621 Rectal polyp: Secondary | ICD-10-CM | POA: Insufficient documentation

## 2024-10-03 HISTORY — PX: COLONOSCOPY: SHX5424

## 2024-10-03 HISTORY — DX: Other forms of angina pectoris: I20.89

## 2024-10-03 HISTORY — DX: Allergic rhinitis, unspecified: J30.9

## 2024-10-03 HISTORY — DX: Nonrheumatic mitral (valve) insufficiency: I34.0

## 2024-10-03 HISTORY — DX: Other ill-defined heart diseases: I51.89

## 2024-10-03 HISTORY — DX: Sleep apnea, unspecified: G47.30

## 2024-10-03 HISTORY — DX: Obesity, class 1: E66.811

## 2024-10-03 HISTORY — DX: Rheumatic tricuspid insufficiency: I07.1

## 2024-10-03 HISTORY — PX: POLYPECTOMY: SHX149

## 2024-10-03 HISTORY — DX: Obstructive sleep apnea (adult) (pediatric): G47.33

## 2024-10-03 SURGERY — COLONOSCOPY
Anesthesia: General

## 2024-10-03 MED ORDER — PROPOFOL 10 MG/ML IV BOLUS
INTRAVENOUS | Status: DC | PRN
  Administered 2024-10-03 (×3): 20 mg via INTRAVENOUS
  Administered 2024-10-03: 30 mg via INTRAVENOUS
  Administered 2024-10-03 (×3): 20 mg via INTRAVENOUS
  Administered 2024-10-03: 50 mg via INTRAVENOUS
  Administered 2024-10-03 (×2): 30 mg via INTRAVENOUS
  Administered 2024-10-03: 20 mg via INTRAVENOUS
  Administered 2024-10-03: 30 mg via INTRAVENOUS
  Administered 2024-10-03 (×4): 20 mg via INTRAVENOUS

## 2024-10-03 MED ORDER — LIDOCAINE HCL (CARDIAC) PF 100 MG/5ML IV SOSY
PREFILLED_SYRINGE | INTRAVENOUS | Status: DC | PRN
  Administered 2024-10-03: 40 mg via INTRATRACHEAL

## 2024-10-03 MED ORDER — STERILE WATER FOR IRRIGATION IR SOLN
Status: DC | PRN
  Administered 2024-10-03: 1

## 2024-10-03 MED ORDER — PROPOFOL 1000 MG/100ML IV EMUL
INTRAVENOUS | Status: AC
Start: 2024-10-03 — End: 2024-10-03
  Filled 2024-10-03: qty 100

## 2024-10-03 MED ORDER — LACTATED RINGERS IV SOLN
INTRAVENOUS | Status: DC
Start: 2024-10-03 — End: 2024-10-03

## 2024-10-03 MED ORDER — SODIUM CHLORIDE 0.9 % IV SOLN: INTRAVENOUS | Status: DC

## 2024-10-03 SURGICAL SUPPLY — 14 items
CLIP HMST11XOPN 235X2.8X (MISCELLANEOUS) IMPLANT
ELECTRODE REM PT RTRN 9FT ADLT (ELECTROSURGICAL) IMPLANT
GOWN CVR UNV OPN BCK APRN NK (MISCELLANEOUS) ×4 IMPLANT
INJECTABLE ELEVIEW COMP 10 (MISCELLANEOUS) IMPLANT
INJECTOR VARIJECT VIN23 (MISCELLANEOUS) IMPLANT
KIT PROCEDURE OLYMPUS (MISCELLANEOUS) ×2 IMPLANT
MANIFOLD NEPTUNE II (INSTRUMENTS) ×2 IMPLANT
NDL CARR LOCKE SCLERO (NEEDLE) IMPLANT
NEEDLE CARR LOCKE SCLERO (NEEDLE) ×2 IMPLANT
SNARE COLD EXACTO (MISCELLANEOUS) IMPLANT
SNARE SHORT THROW 13M SML OVAL (MISCELLANEOUS) IMPLANT
SYR 10ML LL (SYRINGE) IMPLANT
TRAP ETRAP POLY (MISCELLANEOUS) IMPLANT
WATER STERILE IRR 250ML POUR (IV SOLUTION) ×2 IMPLANT

## 2024-10-03 NOTE — Anesthesia Postprocedure Evaluation (Signed)
 Anesthesia Post Note  Patient: Brookes Craine.  Procedure(s) Performed: COLONOSCOPY POLYPECTOMY, INTESTINE, 5 CLIPS PLACED IN TRANSVERSE COLON.  Patient location during evaluation: PACU Anesthesia Type: General Level of consciousness: awake and alert Pain management: pain level controlled Vital Signs Assessment: post-procedure vital signs reviewed and stable Respiratory status: spontaneous breathing, nonlabored ventilation, respiratory function stable and patient connected to nasal cannula oxygen Cardiovascular status: blood pressure returned to baseline and stable Postop Assessment: no apparent nausea or vomiting Anesthetic complications: no   No notable events documented.   Last Vitals:  Vitals:   10/03/24 0845 10/03/24 0850  BP: 106/71 130/75  Pulse: (!) 58 (!) 58  Resp: 11 14  Temp:    SpO2: 97% 97%    Last Pain:  Vitals:   10/03/24 0850  TempSrc:   PainSc: 0-No pain                 Zai Chmiel C Tedd Cottrill

## 2024-10-03 NOTE — H&P (Signed)
 Kenneth JONELLE Brooklyn, MD [facility], DHIP [address]  [place], [place] [address]  Main: [phone] Fax:  [phone] Pager: [phone]   Primary Care Physician:  Jeffie Cheryl BRAVO, MD Primary Gastroenterologist:  Dr. Corinn JONELLE Chapman  Pre-Procedure History & Physical: HPI:  Kenneth Chapman. is a 63 y.o. male is here for an colonoscopy.   Past Medical History:  Diagnosis Date   Allergic rhinitis    Angina at rest    Asthma    Grade I diastolic dysfunction    Mild mitral regurgitation by prior echocardiogram    Mild tricuspid regurgitation by prior echocardiogram    Obesity (BMI 30.0-34.9)    OSA on CPAP    Sleep apnea    uses CPAP    Past Surgical History:  Procedure Laterality Date   COLONOSCOPY     FRACTURE SURGERY     AGE 25   UMBILICAL HERNIA REPAIR N/A 07/22/2017   Procedure: HERNIA REPAIR UMBILICAL ADULT;  Surgeon: Claudene Larinda Bolder, MD;  Location: [facility] ORS;  Service: General;  Laterality: N/A;  with mesh    Prior to Admission medications   Medication Sig Start Date End Date Taking? Authorizing Provider  albuterol (VENTOLIN HFA) 108 (90 Base) MCG/ACT inhaler Inhale 2 puffs into the lungs every 6 (six) hours as needed for wheezing or shortness of breath.   Yes [provider]  cetirizine (ZYRTEC) 10 MG tablet Take 10 mg by mouth daily.   Yes [provider]  fluticasone-salmeterol (ADVAIR) 500-50 MCG/ACT AEPB Inhale 1 puff into the lungs in the morning and at bedtime.   Yes [provider]  Multiple Vitamins-Minerals (MULTIVITAMIN WITH MINERALS) tablet Take 1 tablet by mouth daily.   Yes [provider]  Omega-3 Fatty Acids (FISH OIL) 1000 MG CAPS Take 1 capsule by mouth daily.   Yes [provider]    Allergies as of 09/22/2024   (No Known Allergies)    History reviewed. No pertinent family history.  Social History   Socioeconomic History   Marital status: Married    Spouse  name: Not on file   Number of children: Not on file   Years of education: Not on file   Highest education level: Not on file  Occupational History   Not on file  Tobacco Use   Smoking status: Never   Smokeless tobacco: Never  Vaping Use   Vaping status: Never Used  Substance and Sexual Activity   Alcohol use: Yes    Comment: occaisionally   Drug use: No   Sexual activity: Not on file  Other Topics Concern   Not on file  Social History Narrative   Not on file   Social Drivers of Health   Financial Resource Strain: Low Risk  (01/26/2024)   Received from [facility]   Overall Financial Resource Strain (CARDIA)    Difficulty of Paying Living Expenses: Not hard at all  Food Insecurity: No Food Insecurity (01/26/2024)   Received from [facility]   Hunger Vital Sign    Within the past 12 months, you worried that your food would run out before you got the money to buy more.: Never true    Within the past 12 months, the food you bought just didn't last and you didn't have money to get more.: Never true  Transportation Needs: No Transportation Needs (01/26/2024)   Received from [facility]   [facility] - Transportation  In the past 12 months, has lack of transportation kept you from medical appointments or from getting medications?: No    Lack of Transportation (Non-Medical): No  Physical Activity: Not on file  Stress: Not on file  Social Connections: Not on file  Intimate Partner Violence: Not on file    Review of Systems: See HPI, otherwise negative ROS  Physical Exam: BP 134/84   Pulse 63   Temp 97.9 F (36.6 C) (Temporal)   Resp 13   Ht 5' 11 (1.803 m)   Wt 100.2 kg   SpO2 98%   BMI 30.82 kg/m  General:   Alert,  pleasant and cooperative in NAD Head:  Normocephalic and atraumatic. Neck:  Supple; no masses or thyromegaly. Lungs:  Clear throughout to auscultation.    Heart:  Regular rate and rhythm. Abdomen:  Soft,  nontender and nondistended. Normal bowel sounds, without guarding, and without rebound.   Neurologic:  Alert and  oriented x4;  grossly normal neurologically.  Impression/Plan: Kenneth F Totino Jr. is here for a colonoscopy to be performed for personal h/o colon adenoma  Risks, benefits, limitations, and alternatives regarding  colonoscopy have been reviewed with the patient.  Questions have been answered.  All parties agreeable.   Kenneth Brooklyn, MD  10/03/2024, 7:38 AM

## 2024-10-03 NOTE — Transfer of Care (Signed)
 Immediate Anesthesia Transfer of Care Note  Patient: Kenneth Chapman.  Procedure(s) Performed: COLONOSCOPY POLYPECTOMY, INTESTINE, 5 CLIPS PLACED IN TRANSVERSE COLON.  Patient Location: PACU  Anesthesia Type: General  Level of Consciousness: awake, alert  and patient cooperative  Airway and Oxygen Therapy: Patient Spontanous Breathing and Patient connected to supplemental oxygen  Post-op Assessment: Post-op Vital signs reviewed, Patient's Cardiovascular Status Stable, Respiratory Function Stable, Patent Airway and No signs of Nausea or vomiting  Post-op Vital Signs: Reviewed and stable  Complications: No notable events documented.

## 2024-10-03 NOTE — Op Note (Signed)
 [facility] Patient Name: Kenneth Chapman Procedure Date: 10/03/2024 7:15 AM MRN: [id number] Account #: [id number] Date of Birth: April 10, 1961 Admit Type: Outpatient Age: 63 Room: [facility] OR ROOM 01 Gender: Male Note Status: Finalized Instrument Name: Arvis [id number] Procedure:             Colonoscopy Indications:           Surveillance: Personal history of adenomatous polyps                         on last colonoscopy 5 years ago, Last colonoscopy:                         March 2015 Providers:             Corinn Jess Brooklyn MD, MD Referring MD:          Cheryl CHARLENA Jericho (Referring MD) Medicines:             General Anesthesia Complications:         No immediate complications. Estimated blood loss: None. Procedure:             Pre-Anesthesia Assessment:                        - Prior to the procedure, a History and Physical was                         performed, and patient medications and allergies were                         reviewed. The patient is competent. The risks and                         benefits of the procedure and the sedation options and                         risks were discussed with the patient. All questions                         were answered and informed consent was obtained.                         Patient identification and proposed procedure were                         verified by the physician, the nurse, the                         anesthesiologist, the anesthetist and the technician                         in the pre-procedure area in the procedure room in the                         endoscopy suite. Mental Status Examination: alert and                         oriented. Airway Examination: normal oropharyngeal  airway and neck mobility. Respiratory Examination:                         clear to auscultation. CV Examination: normal.                         Prophylactic Antibiotics: The patient  does not require                         prophylactic antibiotics. Prior Anticoagulants: The                         patient has taken no anticoagulant or antiplatelet                         agents. ASA Grade Assessment: III - A patient with                         severe systemic disease. After reviewing the risks and                         benefits, the patient was deemed in satisfactory                         condition to undergo the procedure. The anesthesia                         plan was to use general anesthesia. Immediately prior                         to administration of medications, the patient was                         re-assessed for adequacy to receive sedatives. The                         heart rate, respiratory rate, oxygen saturations,                         blood pressure, adequacy of pulmonary ventilation, and                         response to care were monitored throughout the                         procedure. The physical status of the patient was                         re-assessed after the procedure.                        After obtaining informed consent, the colonoscope was                         passed under direct vision. Throughout the procedure,                         the patient's blood pressure, pulse, and oxygen  saturations were monitored continuously. The                         Colonoscope was introduced through the anus and                         advanced to the the cecum, identified by appendiceal                         orifice and ileocecal valve. The colonoscopy was                         performed with moderate difficulty due to significant                         looping and the patient's body habitus. Successful                         completion of the procedure was aided by applying                         abdominal pressure. The patient tolerated the                         procedure well. The quality of  the bowel preparation                         was evaluated using the BBPS [facility] Bowel Preparation                         Scale) with scores of: Right Colon = 3, Transverse                         Colon = 3 and Left Colon = 3 (entire mucosa seen well                         with no residual staining, small fragments of stool or                         opaque liquid). The total BBPS score equals 9. The                         ileocecal valve, appendiceal orifice, and rectum were                         photographed. Findings:      Skin tags were found on perianal exam.      A 15 mm polyp was found in the transverse colon. The polyp was       carpet-like and flat. Preparations were made for mucosal resection.       Demarcation of the lesion was performed with narrow band imaging to       clearly identify the boundaries of the lesion. Eleview was injected to       raise the lesion. Snare mucosal resection was performed. Resection and       retrieval were complete. Resected tissue including tissue margins will       be examined by histology.  To prevent bleeding after mucosal resection,       three hemostatic clips were successfully placed (MR safe). Clip       manufacturer: [employer]. There was no bleeding during, or at the       end, of the procedure.      A 10 mm polyp was found in the transverse colon. The polyp was       carpet-like and flat. Preparations were made for mucosal resection.       Demarcation of the lesion was performed with narrow band imaging to       clearly identify the boundaries of the lesion. Eleview was injected to       raise the lesion. Snare mucosal resection was performed. Resection and       retrieval were complete. Resected tissue including tissue margins will       be examined by histology. To prevent bleeding after mucosal resection,       two hemostatic clips were successfully placed (MR safe). Clip       manufacturer: [employer]. There was  no bleeding during, or at the       end, of the procedure.      Two sessile polyps were found in the descending colon. The polyps were 4       to 6 mm in size. These polyps were removed with a cold snare. Resection       and retrieval were complete. Estimated blood loss: none.      A 5 mm polyp was found in the rectum. The polyp was sessile. The polyp       was removed with a cold snare. Resection and retrieval were complete.       Estimated blood loss: none.      An 8 mm polyp was found in the distal rectum. The polyp was sessile. The       polyp was removed with a hot snare. Resection and retrieval were       complete.      The exam was otherwise without abnormality.      No additional abnormalities were found on retroflexion. Impression:            - Perianal skin tags found on perianal exam.                        - One 15 mm polyp in the transverse colon, removed                         with mucosal resection. Resected and retrieved. Clip                         manufacturer: [employer]. Clips (MR safe) were                         placed.                        - One 10 mm polyp in the transverse colon, removed                         with mucosal resection. Resected and retrieved. Clip                         manufacturer:  [employer]. Clips (MR safe) were                         placed.                        - Two 4 to 6 mm polyps in the descending colon,                         removed with a cold snare. Resected and retrieved.                        - One 5 mm polyp in the rectum, removed with a cold                         snare. Resected and retrieved.                        - One 8 mm polyp in the distal rectum, removed with a                         hot snare. Resected and retrieved.                        - The examination was otherwise normal.                        - Mucosal resection was performed. Resection and                         retrieval were  complete.                        - Mucosal resection was performed. Resection and                         retrieval were complete. Recommendation:        - Discharge patient to home (with escort).                        - Resume previous diet today.                        - Continue present medications.                        - Await pathology results.                        - Repeat colonoscopy in 3 years for surveillance of                         multiple polyps. Procedure Code(s):     --- Professional ---                        [phone], Colonoscopy, flexible; with endoscopic mucosal                         resection  54614, 59, Colonoscopy, flexible; with removal of                         tumor(s), polyp(s), or other lesion(s) by snare                         technique Diagnosis Code(s):     --- Professional ---                        Z86.010, Personal history of colonic polyps                        D12.3, Benign neoplasm of transverse colon (hepatic                         flexure or splenic flexure)                        D12.8, Benign neoplasm of rectum                        D12.4, Benign neoplasm of descending colon                        K64.4, Residual hemorrhoidal skin tags CPT copyright 2022 [employer]. All rights reserved. The codes documented in this report are preliminary and upon coder review may  be revised to meet current compliance requirements. Dr. Corinn Brooklyn Corinn Jess Brooklyn MD, MD 10/03/2024 8:40:11 AM This report has been signed electronically. Number of Addenda: 0 Note Initiated On: 10/03/2024 7:15 AM Scope Withdrawal Time: 0 hours 36 minutes 29 seconds  Total Procedure Duration: 0 hours 41 minutes 44 seconds  Estimated Blood Loss:  Estimated blood loss: none.      [facility]

## 2024-10-04 ENCOUNTER — Encounter: Payer: Self-pay | Admitting: Gastroenterology

## 2024-10-05 ENCOUNTER — Ambulatory Visit: Payer: Self-pay | Admitting: Gastroenterology

## 2024-10-05 LAB — SURGICAL PATHOLOGY

## 2024-10-05 NOTE — Progress Notes (Signed)
 Recommend surveillance colonoscopy in 3 years  RV
